# Patient Record
Sex: Male | Born: 1975 | Race: White | Hispanic: No | Marital: Married | Smoking: Never smoker
Health system: Southern US, Community
[De-identification: ages and names within clinical notes are randomized; demographics above are authoritative.]

## PROBLEM LIST (undated history)

## (undated) DIAGNOSIS — H7090 Unspecified mastoiditis, unspecified ear: Secondary | ICD-10-CM

## (undated) DIAGNOSIS — N200 Calculus of kidney: Secondary | ICD-10-CM

## (undated) HISTORY — PX: OTHER SURGICAL HISTORY: SHX169

---

## 2009-09-30 ENCOUNTER — Inpatient Hospital Stay: Payer: Self-pay | Admitting: Internal Medicine

## 2011-02-12 ENCOUNTER — Emergency Department: Payer: Self-pay | Admitting: Emergency Medicine

## 2011-02-13 ENCOUNTER — Emergency Department: Payer: Self-pay | Admitting: Unknown Physician Specialty

## 2011-02-17 ENCOUNTER — Emergency Department: Payer: Self-pay | Admitting: Emergency Medicine

## 2011-03-04 ENCOUNTER — Ambulatory Visit: Payer: Self-pay | Admitting: Family Medicine

## 2011-03-10 ENCOUNTER — Encounter: Payer: Self-pay | Admitting: Family Medicine

## 2011-04-04 ENCOUNTER — Encounter: Payer: Self-pay | Admitting: Family Medicine

## 2011-04-28 ENCOUNTER — Ambulatory Visit: Payer: Self-pay | Admitting: Unknown Physician Specialty

## 2011-05-05 ENCOUNTER — Encounter: Payer: Self-pay | Admitting: Family Medicine

## 2011-05-12 ENCOUNTER — Ambulatory Visit: Payer: Self-pay | Admitting: Family Medicine

## 2011-09-29 ENCOUNTER — Encounter: Payer: Self-pay | Admitting: Unknown Physician Specialty

## 2012-01-13 ENCOUNTER — Encounter: Payer: Self-pay | Admitting: Unknown Physician Specialty

## 2012-02-08 ENCOUNTER — Encounter: Payer: Self-pay | Admitting: Unknown Physician Specialty

## 2012-03-03 ENCOUNTER — Ambulatory Visit: Payer: Self-pay | Admitting: Family Medicine

## 2012-03-04 ENCOUNTER — Encounter: Payer: Self-pay | Admitting: Unknown Physician Specialty

## 2012-04-03 ENCOUNTER — Encounter: Payer: Self-pay | Admitting: Unknown Physician Specialty

## 2012-05-29 ENCOUNTER — Ambulatory Visit: Payer: Self-pay | Admitting: Surgery

## 2012-05-31 LAB — PATHOLOGY REPORT

## 2013-01-12 ENCOUNTER — Ambulatory Visit: Payer: Self-pay | Admitting: Family Medicine

## 2014-02-24 ENCOUNTER — Emergency Department: Payer: Self-pay | Admitting: Emergency Medicine

## 2015-01-21 NOTE — Op Note (Signed)
PATIENT NAME:  Dylan Willis, Dylan Willis MR#:  [id number] DATE OF BIRTH:  1975-12-05  DATE OF PROCEDURE:  05/29/2012  PREOPERATIVE DIAGNOSIS: Mass of the right upper back.   POSTOPERATIVE DIAGNOSIS: Intramuscular lipoma right upper back.   PROCEDURE: Excision mass right upper back.   SURGEON: Renda RollsWilton Smith, M.D.   ANESTHESIA: General.   INDICATIONS: This 39 year old male had a history of mild discomfort and bulging in the right upper back. A mass could be palpated on exam and appeared to be somewhat deep. Surgery was recommended to excise the mass, alleviate his symptoms, and send for pathology.   PROCEDURE:  The patient was placed on the operating table in the supine position under general anesthesia. He was rolled into the left lateral decubitus position and the bean bag cushion was used and it was activated. The right arm was placed on a padded Mayo stand. Tape was used to help hold his arm in position. The mass was palpable below the tip of the scapula. The site was prepared with ChloraPrep and draped in a sterile manner.   An infrascapular, transversely-oriented incision was made, which was lengthened several times during the course of the procedure, which ultimately was a length of some 10 cm. The incision was carried down through subcutaneous tissues. Several small bleeding points were cauterized. The superficial fascia was incised. Dissection was carried down to the deep fascia, which was incised transversely. The fascia was separated from the underlying latissimus dorsi muscle.  The muscles fibers were spread and I dissected down approximately another 1.5 cm to encounter a fatty-appearing demarcated mass, which was dissected free from surrounding structures. It appeared to be an intramuscular lipoma. Muscle fibers were dissected off the mass with a smooth plane of resection and the mass with somewhat tedious dissection was completely excised. Several small bleeding points were cauterized. The  measurement was 6 cm. It was submitted in formalin for routine pathology. The wound was inspected. Several small bleeding points were cauterized. Hemostasis was subsequently intact. Next, the latissimus dorsi was closed with interrupted 3-0 Monocryl sutures. Next, the deep fascia was closed with interrupted 3-0 Monocryl. The superficial fascia was closed with interrupted 3-0 Monocryl and the skin was closed initially with three inverted subcuticular interrupted 4-0 Monocryl sutures and then the skin was closed with a running 4-0 Monocryl subcuticular suture and Dermabond. The patient tolerated surgery satisfactorily and was then prepared for transfer to the recovery room.     ____________________________ Shela CommonsJ. Renda RollsWilton Smith, MD [id number] D: 05/29/2012 09:16:34 ET T: 05/29/2012 12:34:32 ET JOB#: [id number]  cc: Adella HareJ. Wilton Smith, MD, <Dictator> Adella HareWILTON J SMITH MD ELECTRONICALLY SIGNED 05/31/2012 18:29

## 2015-03-12 ENCOUNTER — Encounter: Payer: Self-pay | Admitting: Emergency Medicine

## 2015-03-12 ENCOUNTER — Emergency Department
Admission: EM | Admit: 2015-03-12 | Discharge: 2015-03-12 | Disposition: A | Discharge status: Home / Self Care | Payer: Medicare Other | Attending: Emergency Medicine | Admitting: Emergency Medicine

## 2015-03-12 ENCOUNTER — Emergency Department: Payer: Medicare Other

## 2015-03-12 DIAGNOSIS — Z79899 Other long term (current) drug therapy: Secondary | ICD-10-CM | POA: Insufficient documentation

## 2015-03-12 DIAGNOSIS — Z791 Long term (current) use of non-steroidal anti-inflammatories (NSAID): Secondary | ICD-10-CM | POA: Insufficient documentation

## 2015-03-12 DIAGNOSIS — N201 Calculus of ureter: Secondary | ICD-10-CM | POA: Diagnosis not present

## 2015-03-12 DIAGNOSIS — R109 Unspecified abdominal pain: Secondary | ICD-10-CM | POA: Diagnosis present

## 2015-03-12 DIAGNOSIS — Z7951 Long term (current) use of inhaled steroids: Secondary | ICD-10-CM | POA: Diagnosis not present

## 2015-03-12 DIAGNOSIS — Z880 Allergy status to penicillin: Secondary | ICD-10-CM | POA: Insufficient documentation

## 2015-03-12 HISTORY — DX: Calculus of kidney: N20.0

## 2015-03-12 LAB — URINALYSIS COMPLETE WITH MICROSCOPIC (ARMC ONLY)
BILIRUBIN URINE: NEGATIVE
Glucose, UA: NEGATIVE mg/dL
Ketones, ur: NEGATIVE mg/dL
NITRITE: NEGATIVE
PH: 5 (ref 5.0–8.0)
Protein, ur: NEGATIVE mg/dL
Specific Gravity, Urine: 1.017 (ref 1.005–1.030)

## 2015-03-12 LAB — CBC WITH DIFFERENTIAL/PLATELET
Basophils Absolute: 0.2 10*3/uL — ABNORMAL HIGH (ref 0–0.1)
Basophils Relative: 3 %
EOS ABS: 0.1 10*3/uL (ref 0–0.7)
Eosinophils Relative: 1 %
HEMATOCRIT: 37.2 % — AB (ref 40.0–52.0)
Hemoglobin: 12.3 g/dL — ABNORMAL LOW (ref 13.0–18.0)
LYMPHS ABS: 0.7 10*3/uL — AB (ref 1.0–3.6)
Lymphocytes Relative: 7 %
MCH: 28.8 pg (ref 26.0–34.0)
MCHC: 33.2 g/dL (ref 32.0–36.0)
MCV: 86.8 fL (ref 80.0–100.0)
MONOS PCT: 5 %
Monocytes Absolute: 0.4 10*3/uL (ref 0.2–1.0)
NEUTROS ABS: 7.8 10*3/uL — AB (ref 1.4–6.5)
NEUTROS PCT: 84 %
PLATELETS: 169 10*3/uL (ref 150–440)
RBC: 4.29 MIL/uL — AB (ref 4.40–5.90)
RDW: 13.2 % (ref 11.5–14.5)
WBC: 9.3 10*3/uL (ref 3.8–10.6)

## 2015-03-12 LAB — COMPREHENSIVE METABOLIC PANEL
ALK PHOS: 50 U/L (ref 38–126)
ALT: 24 U/L (ref 17–63)
AST: 19 U/L (ref 15–41)
Albumin: 4.6 g/dL (ref 3.5–5.0)
Anion gap: 9 (ref 5–15)
BILIRUBIN TOTAL: 0.6 mg/dL (ref 0.3–1.2)
BUN: 21 mg/dL — AB (ref 6–20)
CHLORIDE: 105 mmol/L (ref 101–111)
CO2: 26 mmol/L (ref 22–32)
Calcium: 9.1 mg/dL (ref 8.9–10.3)
Creatinine, Ser: 1.28 mg/dL — ABNORMAL HIGH (ref 0.61–1.24)
GFR calc Af Amer: >60 mL/min (ref >60)
Glucose, Bld: 100 mg/dL — ABNORMAL HIGH (ref 65–99)
POTASSIUM: 3.9 mmol/L (ref 3.5–5.1)
Sodium: 140 mmol/L (ref 135–145)
TOTAL PROTEIN: 7.7 g/dL (ref 6.5–8.1)

## 2015-03-12 LAB — LIPASE, BLOOD: Lipase: 46 U/L (ref 22–51)

## 2015-03-12 MED ORDER — MORPHINE SULFATE 4 MG/ML IJ SOLN
INTRAMUSCULAR | Status: AC
  Filled 2015-03-12: qty 1

## 2015-03-12 MED ORDER — TAMSULOSIN HCL 0.4 MG PO CAPS: 0.4000 mg | ORAL_CAPSULE | Freq: Every day | ORAL | Status: AC

## 2015-03-12 MED ORDER — MORPHINE SULFATE 4 MG/ML IJ SOLN
INTRAMUSCULAR | Status: AC
  Administered 2015-03-12: 4 mg
  Filled 2015-03-12: qty 1

## 2015-03-12 MED ORDER — OXYCODONE HCL 5 MG PO TABS: 5.0000 mg | ORAL_TABLET | Freq: Four times a day (QID) | ORAL | Status: DC | PRN

## 2015-03-12 MED ORDER — MORPHINE SULFATE 4 MG/ML IJ SOLN
4.0000 mg | Freq: Once | INTRAMUSCULAR | Status: AC
  Administered 2015-03-12: 4 mg via INTRAVENOUS

## 2015-03-12 MED ORDER — ONDANSETRON HCL 4 MG/2ML IJ SOLN
INTRAMUSCULAR | Status: DC
Start: 2015-03-12 — End: 2015-03-12
  Filled 2015-03-12: qty 2

## 2015-03-12 MED ORDER — SODIUM CHLORIDE 0.9 % IV BOLUS (SEPSIS)
1000.0000 mL | Freq: Once | INTRAVENOUS | Status: AC
  Administered 2015-03-12: 1000 mL via INTRAVENOUS

## 2015-03-12 MED ORDER — TAMSULOSIN HCL 0.4 MG PO CAPS: 0.4000 mg | ORAL_CAPSULE | Freq: Once | ORAL | Status: DC

## 2015-03-12 MED ORDER — ONDANSETRON HCL 4 MG/2ML IJ SOLN
4.0000 mg | Freq: Once | INTRAMUSCULAR | Status: AC
  Administered 2015-03-12: 4 mg via INTRAVENOUS

## 2015-03-12 NOTE — ED Notes (Signed)
Pt transported to US

## 2015-03-12 NOTE — ED Notes (Signed)
Reports right flank pain and burning with urination

## 2015-03-12 NOTE — ED Provider Notes (Signed)
[facility] Emergency Department Provider Note  ____________________________________________  Time seen: Approximately 8:30 AM  I have reviewed the triage vital signs and the nursing notes.   HISTORY  Chief Complaint Flank Pain    HPI Marya LandryDavid R Valls is a 39 y.o. male the history of kidney stones who presents with several days of right flank pain. He says the pain has been intermittent but steadily increasing over the past several days. The pain is sharp and cramping and to his right flank and lumbar area but radiating around to the right lower abdomen. He vomited once yesterday. Slight burning upon urination but no gross blood in the urine. Says feels similar to previous kidney stones.   Past Medical History  Diagnosis Date  . Kidney stones   . Kidney stone     There are no active problems to display for this patient.   History reviewed. No pertinent past surgical history.  Current Outpatient Rx  Name  Route  Sig  Dispense  Refill  . albuterol (PROVENTIL HFA;VENTOLIN HFA) 108 (90 BASE) MCG/ACT inhaler   Inhalation   Inhale 1-2 puffs into the lungs every 6 (six) hours as needed for wheezing or shortness of breath.         . escitalopram (LEXAPRO) 20 MG tablet   Oral   Take 20 mg by mouth every morning.         . fluticasone (FLOVENT HFA) 220 MCG/ACT inhaler   Inhalation   Inhale 1 puff into the lungs 2 (two) times daily.         Marland Kitchen. glucose 4 GM chewable tablet   Oral   Chew 1 tablet by mouth as needed for low blood sugar.         . levothyroxine (SYNTHROID, LEVOTHROID) 150 MCG tablet   Oral   Take 150 mcg by mouth daily.         . meloxicam (MOBIC) 7.5 MG tablet   Oral   Take 7.5 mg by mouth 2 (two) times daily.         . montelukast (SINGULAIR) 10 MG tablet   Oral   Take 10 mg by mouth daily.         Marland Kitchen. oxycodone (OXY-IR) 5 MG capsule   Oral   Take 5 mg by mouth every 8 (eight) hours as needed for pain.            Allergies Ibuprofen; Penicillins; Antihistamines, diphenhydramine-type; Pseudoephedrine hcl; Salicylates; and Acetaminophen  History reviewed. No pertinent family history.  Social History History  Substance Use Topics  . Smoking status: Never Smoker   . Smokeless tobacco: Not on file  . Alcohol Use: No    Review of Systems Constitutional: No fever/chills Eyes: No visual changes. ENT: No sore throat. Cardiovascular: Denies chest pain. Respiratory: Denies shortness of breath. Gastrointestinal: As above Genitourinary: Negative for dysuria. Musculoskeletal: Right lumbar pain  Skin: Negative for rash. Neurological: Negative for headaches, focal weakness or numbness.  10-point ROS otherwise negative.  ____________________________________________   PHYSICAL EXAM:  VITAL SIGNS: ED Triage Vitals  Enc Vitals Group     BP 03/12/15 0834 155/100 mmHg     Pulse Rate 03/12/15 0834 71     Resp 03/12/15 0834 20     Temp 03/12/15 0834 98.1 F (36.7 C)     Temp Source 03/12/15 0834 Oral     SpO2 03/12/15 0834 98 %     Weight 03/12/15 0834 247 lb (112.038 kg)  Height 03/12/15 0834 6' (1.829 m)     Head Cir --      Peak Flow --      Pain Score 03/12/15 0835 10     Pain Loc --      Pain Edu? --      Excl. in GC? --     Constitutional: Alert and oriented. Well appearing and in no acute distress. Eyes: Conjunctivae are normal. PERRL. EOMI. Head: Atraumatic. Nose: No congestion/rhinnorhea. Mouth/Throat: Mucous membranes are moist.  Oropharynx non-erythematous. Neck: No stridor.   Cardiovascular: Normal rate, regular rhythm. Grossly normal heart sounds.  Good peripheral circulation. Respiratory: Normal respiratory effort.  No retractions. Lungs CTAB. Gastrointestinal: Soft and nontender. No distention. No abdominal bruits. Right flank and right lumbar tenderness. Musculoskeletal: No lower extremity tenderness nor edema.  No joint effusions. Neurologic:  Normal speech and  language. No gross focal neurologic deficits are appreciated. Speech is normal. No gait instability. Skin:  Skin is warm, dry and intact. No rash noted. Psychiatric: Mood and affect are normal. Speech and behavior are normal.  ____________________________________________   LABS (all labs ordered are listed, but only abnormal results are displayed)  Labs Reviewed  CBC WITH DIFFERENTIAL/PLATELET - Abnormal; Notable for the following:    RBC 4.29 (*)    Hemoglobin 12.3 (*)    HCT 37.2 (*)    Neutro Abs 7.8 (*)    Lymphs Abs 0.7 (*)    Basophils Absolute 0.2 (*)    All other components within normal limits  COMPREHENSIVE METABOLIC PANEL - Abnormal; Notable for the following:    Glucose, Bld 100 (*)    BUN 21 (*)    Creatinine, Ser 1.28 (*)    All other components within normal limits  URINALYSIS COMPLETEWITH MICROSCOPIC ([facility] ONLY) - Abnormal; Notable for the following:    Color, Urine YELLOW (*)    APPearance CLEAR (*)    Hgb urine dipstick 3+ (*)    Leukocytes, UA TRACE (*)    Bacteria, UA RARE (*)    Squamous Epithelial / LPF 0-5 (*)    All other components within normal limits  LIPASE, BLOOD   ____________________________________________  EKG   ____________________________________________  RADIOLOGY   Mild right-sided hydro-on ultrasound. Likely 5 mm distal stone on KUB. ____________________________________________   PROCEDURES   ____________________________________________   INITIAL IMPRESSION / ASSESSMENT AND PLAN / ED COURSE  Pertinent labs & imaging results that were available during my care of the patient were reviewed by me and considered in my medical decision making (see chart for details).  ----------------------------------------- 12:23 PM on 03/12/2015 -----------------------------------------  Patient resting comfortably after 2 doses of morphine. Discussed case with Vanna Scotland of urology who will see patient has follow-up in the office.  We'll discharge the patient with Flomax. We'll discharge also with a strainer. ____________________________________________   FINAL CLINICAL IMPRESSION(S) / ED DIAGNOSES  Acute ureterolithiasis. Initial visit.    Myrna Blazer, MD 03/12/15 1224

## 2015-03-12 NOTE — Discharge Instructions (Signed)

## 2015-03-21 ENCOUNTER — Ambulatory Visit: Payer: Self-pay | Admitting: Urology

## 2018-03-30 ENCOUNTER — Emergency Department
Admission: EM | Admit: 2018-03-30 | Discharge: 2018-03-30 | Disposition: A | Discharge status: Home / Self Care | Payer: Medicare Other | Attending: Emergency Medicine | Admitting: Emergency Medicine

## 2018-03-30 ENCOUNTER — Emergency Department: Payer: Medicare Other

## 2018-03-30 ENCOUNTER — Other Ambulatory Visit: Payer: Self-pay

## 2018-03-30 ENCOUNTER — Encounter: Payer: Self-pay | Admitting: Emergency Medicine

## 2018-03-30 DIAGNOSIS — R509 Fever, unspecified: Secondary | ICD-10-CM | POA: Diagnosis present

## 2018-03-30 DIAGNOSIS — R51 Headache: Secondary | ICD-10-CM | POA: Insufficient documentation

## 2018-03-30 DIAGNOSIS — N390 Urinary tract infection, site not specified: Secondary | ICD-10-CM

## 2018-03-30 DIAGNOSIS — R300 Dysuria: Secondary | ICD-10-CM | POA: Diagnosis not present

## 2018-03-30 DIAGNOSIS — Z79899 Other long term (current) drug therapy: Secondary | ICD-10-CM | POA: Insufficient documentation

## 2018-03-30 DIAGNOSIS — R531 Weakness: Secondary | ICD-10-CM | POA: Insufficient documentation

## 2018-03-30 DIAGNOSIS — R5381 Other malaise: Secondary | ICD-10-CM | POA: Insufficient documentation

## 2018-03-30 HISTORY — DX: Unspecified mastoiditis, unspecified ear: H70.90

## 2018-03-30 LAB — COMPREHENSIVE METABOLIC PANEL
ALK PHOS: 64 U/L (ref 38–126)
ALT: 25 U/L (ref 0–44)
ANION GAP: 11 (ref 5–15)
AST: 22 U/L (ref 15–41)
Albumin: 4.7 g/dL (ref 3.5–5.0)
BUN: 18 mg/dL (ref 6–20)
CALCIUM: 9 mg/dL (ref 8.9–10.3)
CO2: 22 mmol/L (ref 22–32)
Chloride: 100 mmol/L (ref 98–111)
Creatinine, Ser: 1.13 mg/dL (ref 0.61–1.24)
Glucose, Bld: 132 mg/dL — ABNORMAL HIGH (ref 70–99)
Potassium: 4.2 mmol/L (ref 3.5–5.1)
SODIUM: 133 mmol/L — AB (ref 135–145)
TOTAL PROTEIN: 8 g/dL (ref 6.5–8.1)
Total Bilirubin: 1.8 mg/dL — ABNORMAL HIGH (ref 0.3–1.2)

## 2018-03-30 LAB — CBC WITH DIFFERENTIAL/PLATELET
BASOS ABS: 0 10*3/uL (ref 0–0.1)
BASOS PCT: 0 %
Eosinophils Absolute: 0 10*3/uL (ref 0–0.7)
Eosinophils Relative: 0 %
HCT: 38.5 % — ABNORMAL LOW (ref 40.0–52.0)
Hemoglobin: 13 g/dL (ref 13.0–18.0)
Lymphocytes Relative: 5 %
Lymphs Abs: 0.6 10*3/uL — ABNORMAL LOW (ref 1.0–3.6)
MCH: 29.2 pg (ref 26.0–34.0)
MCHC: 33.7 g/dL (ref 32.0–36.0)
MCV: 86.7 fL (ref 80.0–100.0)
Monocytes Absolute: 0.2 10*3/uL (ref 0.2–1.0)
Monocytes Relative: 1 %
NEUTROS ABS: 11.9 10*3/uL — AB (ref 1.4–6.5)
NEUTROS PCT: 94 %
Platelets: 178 10*3/uL (ref 150–440)
RBC: 4.44 MIL/uL (ref 4.40–5.90)
RDW: 14.1 % (ref 11.5–14.5)
WBC: 12.7 10*3/uL — AB (ref 3.8–10.6)

## 2018-03-30 LAB — URINALYSIS, COMPLETE (UACMP) WITH MICROSCOPIC
BACTERIA UA: NONE SEEN
Bilirubin Urine: NEGATIVE
GLUCOSE, UA: NEGATIVE mg/dL
KETONES UR: NEGATIVE mg/dL
NITRITE: NEGATIVE
PH: 5 (ref 5.0–8.0)
PROTEIN: 100 mg/dL — AB
Specific Gravity, Urine: 1.026 (ref 1.005–1.030)
WBC, UA: >50 WBC/hpf — ABNORMAL HIGH (ref 0–5)

## 2018-03-30 LAB — LACTIC ACID, PLASMA
LACTIC ACID, VENOUS: 1.3 mmol/L (ref 0.5–1.9)
Lactic Acid, Venous: 1.8 mmol/L (ref 0.5–1.9)

## 2018-03-30 MED ORDER — SODIUM CHLORIDE 0.9 % IV BOLUS
1000.0000 mL | Freq: Once | INTRAVENOUS | Status: AC
  Administered 2018-03-30: 1000 mL via INTRAVENOUS

## 2018-03-30 MED ORDER — CEFTRIAXONE SODIUM 1 G IJ SOLR
1.0000 g | Freq: Once | INTRAMUSCULAR | Status: AC
  Administered 2018-03-30: 1 g via INTRAVENOUS
  Filled 2018-03-30: qty 10

## 2018-03-30 MED ORDER — CEPHALEXIN 500 MG PO CAPS: 500.0000 mg | ORAL_CAPSULE | Freq: Two times a day (BID) | ORAL | 0 refills | Status: AC

## 2018-03-30 NOTE — ED Notes (Addendum)
Blood cultures drawn and sent with save labels d/t impending antibiotic administration as well as RR/HR/temp in triage. Meets SIRS criteria for sepsis workup. No orders at this time. MD aware that blood cultures have been sent. Second lactate still drawn.

## 2018-03-30 NOTE — ED Provider Notes (Signed)
[facility] Emergency Department Provider Note ____________________________________________   First MD Initiated Contact with Patient 03/30/18 1334     (approximate)  I have reviewed the triage vital signs and the nursing notes.   HISTORY  Chief Complaint Fever    HPI Dylan Willis is a 42 y.o. male with PMH as noted below who presents with fever for 1 day, measured to 104 at home, associated with generalized weakness and malaise, headache, and dysuria.  Patient denies hematuria or any flank or back pain.  He denies vomiting or diarrhea, or any respiratory symptoms.  He reports prior history of kidney stones as well as UTI in the past.  Past Medical History:  Diagnosis Date  . Kidney stone   . Kidney stones   . Mastoiditis     There are no active problems to display for this patient.   Past Surgical History:  Procedure Laterality Date  . ear surgery Right     Prior to Admission medications   Medication Sig Start Date End Date Taking? Authorizing Provider  albuterol (PROVENTIL HFA;VENTOLIN HFA) 108 (90 BASE) MCG/ACT inhaler Inhale 1-2 puffs into the lungs every 6 (six) hours as needed for wheezing or shortness of breath.    [provider]  cephALEXin (KEFLEX) 500 MG capsule Take 1 capsule (500 mg total) by mouth 2 (two) times daily for 14 days. 03/30/18 04/13/18  Dionne Bucy, MD  escitalopram (LEXAPRO) 20 MG tablet Take 20 mg by mouth every morning.    [provider]  fluticasone (FLOVENT HFA) 220 MCG/ACT inhaler Inhale 1 puff into the lungs 2 (two) times daily.    [provider]  glucose 4 GM chewable tablet Chew 1 tablet by mouth as needed for low blood sugar.    [provider]  levothyroxine (SYNTHROID, LEVOTHROID) 150 MCG tablet Take 150 mcg by mouth daily.    [provider]  meloxicam (MOBIC) 7.5 MG tablet Take 7.5 mg by mouth 2 (two) times daily.    [provider]  montelukast  (SINGULAIR) 10 MG tablet Take 10 mg by mouth daily.    [provider]  oxyCODONE (OXY IR/ROXICODONE) 5 MG immediate release tablet Take 1 tablet (5 mg total) by mouth every 6 (six) hours as needed for severe pain. 03/12/15   Schaevitz, Myra Rude, MD  tamsulosin (FLOMAX) 0.4 MG CAPS capsule Take 1 capsule (0.4 mg total) by mouth daily. 03/12/15   Myrna Blazer, MD    Allergies Ibuprofen; Penicillins; Antihistamines, diphenhydramine-type; Pseudoephedrine hcl; Salicylates; and Acetaminophen  No family history on file.  Social History Social History   Tobacco Use  . Smoking status: Never Smoker  . Smokeless tobacco: Never Used  Substance Use Topics  . Alcohol use: No  . Drug use: Not on file    Review of Systems  Constitutional: Positive for fever. Eyes: No redness. ENT: No sore throat. Cardiovascular: Denies chest pain. Respiratory: Denies shortness of breath. Gastrointestinal: No vomiting or diarrhea.  Genitourinary: N positive for dysuria.  Musculoskeletal: Negative for back pain. Skin: Negative for rash. Neurological: Positive for headache.   ____________________________________________   PHYSICAL EXAM:  VITAL SIGNS: ED Triage Vitals  Enc Vitals Group     BP 03/30/18 1135 124/69     Pulse Rate 03/30/18 1135 (!) 114     Resp 03/30/18 1134 16     Temp 03/30/18 1134 (!) 102.9 F (39.4 C)     Temp Source 03/30/18 1134 Oral  SpO2 03/30/18 1135 94 %     Weight 03/30/18 1132 260 lb (117.9 kg)     Height 03/30/18 1132 6' (1.829 m)     Head Circumference --      Peak Flow --      Pain Score 03/30/18 1132 7     Pain Loc --      Pain Edu? --      Excl. in GC? --     Constitutional: Alert and oriented.  Relatively well appearing and in no acute distress. Eyes: Conjunctivae are normal.  Head: Atraumatic. Nose: No congestion/rhinnorhea. Mouth/Throat: Mucous membranes are slightly dry.   Neck: Normal range of motion.  Cardiovascular: Normal  rate, regular rhythm. Grossly normal heart sounds.  Good peripheral circulation. Respiratory: Normal respiratory effort.  No retractions. Lungs CTAB. Gastrointestinal: Soft and nontender. No distention.  Genitourinary: No CVA tenderness. Musculoskeletal:  Extremities warm and well perfused.  Neurologic:  Normal speech and language. No gross focal neurologic deficits are appreciated.  Skin:  Skin is warm and dry. No rash noted. Psychiatric: Mood and affect are normal. Speech and behavior are normal.  ____________________________________________   LABS (all labs ordered are listed, but only abnormal results are displayed)  Labs Reviewed  COMPREHENSIVE METABOLIC PANEL - Abnormal; Notable for the following components:      Result Value   Sodium 133 (*)    Glucose, Bld 132 (*)    Total Bilirubin 1.8 (*)    All other components within normal limits  CBC WITH DIFFERENTIAL/PLATELET - Abnormal; Notable for the following components:   WBC 12.7 (*)    HCT 38.5 (*)    Neutro Abs 11.9 (*)    Lymphs Abs 0.6 (*)    All other components within normal limits  URINALYSIS, COMPLETE (UACMP) WITH MICROSCOPIC - Abnormal; Notable for the following components:   Color, Urine AMBER (*)    APPearance TURBID (*)    Hgb urine dipstick LARGE (*)    Protein, ur 100 (*)    Leukocytes, UA MODERATE (*)    RBC / HPF >50 (*)    WBC, UA >50 (*)    All other components within normal limits  LACTIC ACID, PLASMA  LACTIC ACID, PLASMA   ____________________________________________  EKG   ____________________________________________  RADIOLOGY  CXR: No focal infiltrate or other acute findings  ____________________________________________   PROCEDURES  Procedure(s) performed: No  Procedures  Critical Care performed: No ____________________________________________   INITIAL IMPRESSION / ASSESSMENT AND PLAN / ED COURSE  Pertinent labs & imaging results that were available during my care of the  patient were reviewed by me and considered in my medical decision making (see chart for details).  42 year old male with PMH as noted above presents with 1 day of fever, generalized weakness and malaise, and dysuria.  He reports prior history of kidney stones as well as UTIs in the past.  I reviewed the past medical records in Epic; patient's only prior ED visit here was 3 years ago for a kidney stone, the patient was treated and released.  On exam, the patient is febrile but other vital signs are normal.  He is relatively well-appearing, and the exam is otherwise unremarkable.  No abdominal or CVA tenderness.  Lab work-up from triage is unremarkable, except his UA is consistent with UTI.  Given the fever and dysuria this is consistent with his clinical presentation.  The patient does not know of any specific urologic abnormality or anything that might be predisposing him to  UTI, however he does report a prior history of this.  We will give fluids and a dose of antibiotic here, and send the patient home with oral antibiotics.  I will also recommend that the patient follow-up with urology for further work-up once the infection clears.  ----------------------------------------- 2:53 PM on 03/30/2018 -----------------------------------------  Patient is stable for discharge home.  He is penicillin allergic but given his lack of history of anaphylaxis, he is low likelihood to have allergy to cephalosporin.  Ceftriaxone was given in the ED with no adverse effect.  I will prescribe Keflex for home.  I advised the patient on the results of the work-up and the plan of care.  Return precautions given, and he expresses understanding. ____________________________________________   FINAL CLINICAL IMPRESSION(S) / ED DIAGNOSES  Final diagnoses:  Urinary tract infection without hematuria, site unspecified      NEW MEDICATIONS STARTED DURING THIS VISIT:  New Prescriptions   CEPHALEXIN (KEFLEX) 500 MG  CAPSULE    Take 1 capsule (500 mg total) by mouth 2 (two) times daily for 14 days.     Note:  This document was prepared using Dragon voice recognition software and may include unintentional dictation errors.    Dionne Bucy, MD 03/30/18 1455

## 2018-03-30 NOTE — ED Triage Notes (Signed)
C/O fever x 1 day.  This morning also had some vomiting.  Temp was 104 at 1030, 2 Alleve taken.

## 2018-03-30 NOTE — Discharge Instructions (Addendum)
Take the antibiotic as prescribed and finish the full course.  You may take Tylenol for fever.  We have provided referral to a urologist and it is important that you follow-up to help decide if you need any other testing as an outpatient.  Return to the ER for new, worsening, persistent high fever, weakness, vomiting, inability to take the antibiotic, severe back pain, or any other new or worsening symptoms that concern you.

## 2018-04-03 ENCOUNTER — Emergency Department: Payer: Medicare Other

## 2018-04-03 ENCOUNTER — Emergency Department
Admission: EM | Admit: 2018-04-03 | Discharge: 2018-04-03 | Disposition: A | Discharge status: Home / Self Care | Payer: Medicare Other | Attending: Emergency Medicine | Admitting: Emergency Medicine

## 2018-04-03 ENCOUNTER — Other Ambulatory Visit: Payer: Self-pay

## 2018-04-03 DIAGNOSIS — Z79899 Other long term (current) drug therapy: Secondary | ICD-10-CM | POA: Insufficient documentation

## 2018-04-03 DIAGNOSIS — Y939 Activity, unspecified: Secondary | ICD-10-CM | POA: Insufficient documentation

## 2018-04-03 DIAGNOSIS — Y999 Unspecified external cause status: Secondary | ICD-10-CM | POA: Diagnosis not present

## 2018-04-03 DIAGNOSIS — H02846 Edema of left eye, unspecified eyelid: Secondary | ICD-10-CM | POA: Diagnosis present

## 2018-04-03 DIAGNOSIS — Y929 Unspecified place or not applicable: Secondary | ICD-10-CM | POA: Diagnosis not present

## 2018-04-03 DIAGNOSIS — S0240FA Zygomatic fracture, left side, initial encounter for closed fracture: Secondary | ICD-10-CM | POA: Diagnosis not present

## 2018-04-03 DIAGNOSIS — H1132 Conjunctival hemorrhage, left eye: Secondary | ICD-10-CM | POA: Diagnosis not present

## 2018-04-03 MED ORDER — OXYCODONE HCL 5 MG PO TABS: 5.0000 mg | ORAL_TABLET | Freq: Three times a day (TID) | ORAL | 0 refills | Status: AC | PRN

## 2018-04-03 MED ORDER — HYDROCODONE-ACETAMINOPHEN 5-325 MG PO TABS
2.0000 | ORAL_TABLET | Freq: Once | ORAL | Status: AC
  Administered 2018-04-03: 2 via ORAL
  Filled 2018-04-03: qty 2

## 2018-04-03 NOTE — ED Notes (Signed)
ED Provider at bedside. 
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
## 2018-04-03 NOTE — ED Notes (Signed)
Right eye - 20/50 Left eye   - 20/40 Both        - 20/30
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
## 2023-01-09 ENCOUNTER — Encounter (HOSPITAL_COMMUNITY): Payer: Self-pay

## 2023-01-09 ENCOUNTER — Emergency Department (HOSPITAL_COMMUNITY): Payer: Medicaid Other

## 2023-01-09 ENCOUNTER — Other Ambulatory Visit: Payer: Self-pay

## 2023-01-09 ENCOUNTER — Emergency Department (HOSPITAL_COMMUNITY)
Admission: EM | Admit: 2023-01-09 | Discharge: 2023-01-09 | Disposition: A | Discharge status: Home / Self Care | Payer: Medicaid Other | Attending: Emergency Medicine | Admitting: Emergency Medicine

## 2023-01-09 DIAGNOSIS — Z79899 Other long term (current) drug therapy: Secondary | ICD-10-CM | POA: Insufficient documentation

## 2023-01-09 DIAGNOSIS — R519 Headache, unspecified: Secondary | ICD-10-CM | POA: Insufficient documentation

## 2023-01-09 DIAGNOSIS — M79602 Pain in left arm: Secondary | ICD-10-CM | POA: Insufficient documentation

## 2023-01-09 DIAGNOSIS — S0990XA Unspecified injury of head, initial encounter: Secondary | ICD-10-CM | POA: Diagnosis present

## 2023-01-09 DIAGNOSIS — Z23 Encounter for immunization: Secondary | ICD-10-CM | POA: Diagnosis not present

## 2023-01-09 DIAGNOSIS — S0101XA Laceration without foreign body of scalp, initial encounter: Secondary | ICD-10-CM | POA: Diagnosis not present

## 2023-01-09 LAB — COMPREHENSIVE METABOLIC PANEL
ALT: 29 U/L (ref 0–44)
AST: 21 U/L (ref 15–41)
Albumin: 3.4 g/dL — ABNORMAL LOW (ref 3.5–5.0)
Alkaline Phosphatase: 49 U/L (ref 38–126)
Anion gap: 11 (ref 5–15)
BUN: 13 mg/dL (ref 6–20)
CO2: 21 mmol/L — ABNORMAL LOW (ref 22–32)
Calcium: 7.7 mg/dL — ABNORMAL LOW (ref 8.9–10.3)
Chloride: 107 mmol/L (ref 98–111)
Creatinine, Ser: 0.96 mg/dL (ref 0.61–1.24)
GFR, Estimated: >60 mL/min (ref >60)
Glucose, Bld: 90 mg/dL (ref 70–99)
Potassium: 2.9 mmol/L — ABNORMAL LOW (ref 3.5–5.1)
Sodium: 139 mmol/L (ref 135–145)
Total Bilirubin: 0.6 mg/dL (ref 0.3–1.2)
Total Protein: 6 g/dL — ABNORMAL LOW (ref 6.5–8.1)

## 2023-01-09 LAB — CBC
HCT: 34.5 % — ABNORMAL LOW (ref 39.0–52.0)
Hemoglobin: 10.6 g/dL — ABNORMAL LOW (ref 13.0–17.0)
MCH: 28.4 pg (ref 26.0–34.0)
MCHC: 30.7 g/dL (ref 30.0–36.0)
MCV: 92.5 fL (ref 80.0–100.0)
Platelets: 174 10*3/uL (ref 150–400)
RBC: 3.73 MIL/uL — ABNORMAL LOW (ref 4.22–5.81)
RDW: 13.8 % (ref 11.5–15.5)
WBC: 7.2 10*3/uL (ref 4.0–10.5)
nRBC: 0 % (ref 0.0–0.2)

## 2023-01-09 LAB — LACTIC ACID, PLASMA: Lactic Acid, Venous: 1.4 mmol/L (ref 0.5–1.9)

## 2023-01-09 LAB — PROTIME-INR
INR: 1.1 (ref 0.8–1.2)
Prothrombin Time: 14.1 seconds (ref 11.4–15.2)

## 2023-01-09 LAB — ETHANOL: Alcohol, Ethyl (B): <10 mg/dL (ref <10)

## 2023-01-09 MED ORDER — OXYCODONE HCL 5 MG PO TABS: 5.0000 mg | ORAL_TABLET | Freq: Four times a day (QID) | ORAL | 0 refills | Status: AC | PRN

## 2023-01-09 MED ORDER — OXYCODONE HCL 5 MG PO TABS
5.0000 mg | ORAL_TABLET | Freq: Once | ORAL | Status: AC
  Administered 2023-01-09: 5 mg via ORAL
  Filled 2023-01-09: qty 1

## 2023-01-09 MED ORDER — LIDOCAINE-EPINEPHRINE (PF) 2 %-1:200000 IJ SOLN
10.0000 mL | Freq: Once | INTRAMUSCULAR | Status: AC
  Administered 2023-01-09: 10 mL via INTRADERMAL
  Filled 2023-01-09: qty 20

## 2023-01-09 MED ORDER — TETANUS-DIPHTH-ACELL PERTUSSIS 5-2.5-18.5 LF-MCG/0.5 IM SUSY
0.5000 mL | PREFILLED_SYRINGE | Freq: Once | INTRAMUSCULAR | Status: AC
  Administered 2023-01-09: 0.5 mL via INTRAMUSCULAR
  Filled 2023-01-09: qty 0.5

## 2023-01-09 MED ORDER — POTASSIUM CHLORIDE CRYS ER 20 MEQ PO TBCR
20.0000 meq | EXTENDED_RELEASE_TABLET | Freq: Once | ORAL | Status: AC
  Administered 2023-01-09: 20 meq via ORAL
  Filled 2023-01-09: qty 1

## 2023-01-09 MED ORDER — FENTANYL CITRATE PF 50 MCG/ML IJ SOSY
100.0000 ug | PREFILLED_SYRINGE | INTRAMUSCULAR | Status: DC | PRN
  Administered 2023-01-09: 100 ug via INTRAVENOUS
  Filled 2023-01-09: qty 2
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
## 2023-01-09 NOTE — ED Notes (Signed)
EDP at bedside  

## 2023-01-09 NOTE — ED Notes (Signed)
Left arm  arm abrasion cleaned and bandaged and Right hand abrasion cleaned and bandaged.

## 2023-01-09 NOTE — ED Notes (Signed)
Patient transported to CT 
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
## 2023-01-09 NOTE — ED Notes (Signed)
X-ray at bedside

## 2024-06-13 ENCOUNTER — Emergency Department
Admission: EM | Admit: 2024-06-13 | Discharge: 2024-06-14 | Disposition: A | Discharge status: Home / Self Care | Attending: Emergency Medicine | Admitting: Emergency Medicine

## 2024-06-13 ENCOUNTER — Other Ambulatory Visit: Payer: Self-pay

## 2024-06-13 ENCOUNTER — Emergency Department

## 2024-06-13 DIAGNOSIS — R0789 Other chest pain: Secondary | ICD-10-CM | POA: Diagnosis not present

## 2024-06-13 DIAGNOSIS — N179 Acute kidney failure, unspecified: Secondary | ICD-10-CM | POA: Diagnosis not present

## 2024-06-13 DIAGNOSIS — R079 Chest pain, unspecified: Secondary | ICD-10-CM | POA: Diagnosis present

## 2024-06-13 LAB — CBC
HCT: 38.7 % — ABNORMAL LOW (ref 39.0–52.0)
Hemoglobin: 12.4 g/dL — ABNORMAL LOW (ref 13.0–17.0)
MCH: 28.1 pg (ref 26.0–34.0)
MCHC: 32 g/dL (ref 30.0–36.0)
MCV: 87.6 fL (ref 80.0–100.0)
Platelets: 214 K/uL (ref 150–400)
RBC: 4.42 MIL/uL (ref 4.22–5.81)
RDW: 13.7 % (ref 11.5–15.5)
WBC: 8.6 K/uL (ref 4.0–10.5)
nRBC: 0 % (ref 0.0–0.2)

## 2024-06-13 LAB — BASIC METABOLIC PANEL WITH GFR
Anion gap: 11 (ref 5–15)
BUN: 17 mg/dL (ref 6–20)
CO2: 26 mmol/L (ref 22–32)
Calcium: 9.1 mg/dL (ref 8.9–10.3)
Chloride: 100 mmol/L (ref 98–111)
Creatinine, Ser: 1.54 mg/dL — ABNORMAL HIGH (ref 0.61–1.24)
GFR, Estimated: 55 mL/min — ABNORMAL LOW (ref >60)
Glucose, Bld: 97 mg/dL (ref 70–99)
Potassium: 3.9 mmol/L (ref 3.5–5.1)
Sodium: 137 mmol/L (ref 135–145)

## 2024-06-13 LAB — TROPONIN I (HIGH SENSITIVITY): Troponin I (High Sensitivity): 6 ng/L (ref <18)

## 2024-06-13 LAB — CK: Total CK: 122 U/L (ref 49–397)

## 2024-06-13 MED ORDER — SODIUM CHLORIDE 0.9 % IV BOLUS (SEPSIS)
1000.0000 mL | Freq: Once | INTRAVENOUS | Status: AC
  Administered 2024-06-13: 1000 mL via INTRAVENOUS

## 2024-06-13 NOTE — ED Triage Notes (Signed)
 Pt reports he was doing some yard work and began to have chest pain, pt reports hx of enlarged heart. Pt denies accompanying symptoms with the pain.

## 2024-06-13 NOTE — ED Provider Notes (Signed)
 [facility] Provider Note    Event Date/Time   First MD Initiated Contact with Patient 06/13/24 2308     (approximate)   History   Chest Pain   HPI  Dylan Willis is a 48 y.o. male with history of obesity, kidney stones who presents to the emergency department complaints of electric shocklike left-sided chest pain without radiation that occurred earlier today while working in the yard.  He states that symptoms only lasted for a few seconds and then would resolve.  No aggravating or alleviating factors.  Has never had similar symptoms.  No other numbness, tingling, weakness, headache, neck or back pain.  No shortness of breath, nausea or vomiting, fever or cough, lower extremity swelling or pain.  No history of PE, DVT, exogenous estrogen use, recent fractures, surgery, trauma, hospitalization, prolonged travel or other immobilization. No lower extremity swelling or pain. No calf tenderness.  Asymptomatic currently.   History provided by patient, wife.    Past Medical History:  Diagnosis Date   Kidney stone    Kidney stones    Mastoiditis     Past Surgical History:  Procedure Laterality Date   ear surgery Right     MEDICATIONS:  Prior to Admission medications   Medication Sig Start Date End Date Taking? Authorizing Provider  albuterol (PROVENTIL HFA;VENTOLIN HFA) 108 (90 BASE) MCG/ACT inhaler Inhale 1-2 puffs into the lungs every 6 (six) hours as needed for wheezing or shortness of breath.    [provider]  escitalopram (LEXAPRO) 20 MG tablet Take 20 mg by mouth every morning.    [provider]  fluticasone (FLOVENT HFA) 220 MCG/ACT inhaler Inhale 1 puff into the lungs 2 (two) times daily.    [provider]  glucose 4 GM chewable tablet Chew 1 tablet by mouth as needed for low blood sugar.    [provider]  levothyroxine (SYNTHROID, LEVOTHROID) 150 MCG tablet Take 150 mcg by mouth daily.    [provider]  meloxicam (MOBIC) 7.5 MG tablet Take 7.5 mg by mouth 2 (two) times daily.    [provider]  montelukast (SINGULAIR) 10 MG tablet Take 10 mg by mouth daily.    [provider]  tamsulosin  (FLOMAX ) 0.4 MG CAPS capsule Take 1 capsule (0.4 mg total) by mouth daily. 03/12/15   Yvonna Alm Cough, MD    Physical Exam   Triage Vital Signs: ED Triage Vitals  Encounter Vitals Group     BP 06/13/24 2146 (!) 140/93     Girls Systolic BP Percentile --      Girls Diastolic BP Percentile --      Boys Systolic BP Percentile --      Boys Diastolic BP Percentile --      Pulse Rate 06/13/24 2146 76     Resp 06/13/24 2146 20     Temp 06/13/24 2146 98.5 F (36.9 C)     Temp Source 06/13/24 2146 Oral     SpO2 06/13/24 2146 97 %     Weight 06/13/24 2145 280 lb (127 kg)     Height 06/13/24 2145 5' 10 (1.778 m)     Head Circumference --      Peak Flow --      Pain Score 06/13/24 2145 0     Pain Loc --      Pain Education --      Exclude from Growth Chart --     Most recent vital signs:  Vitals:   06/13/24 2146  BP: (!) 140/93  Pulse: 76  Resp: 20  Temp: 98.5 F (36.9 C)  SpO2: 97%    CONSTITUTIONAL: Alert, responds appropriately to questions. Well-appearing; well-nourished HEAD: Normocephalic, atraumatic EYES: Conjunctivae clear, pupils appear equal, sclera nonicteric ENT: normal nose; moist mucous membranes NECK: Supple, normal ROM CARD: RRR; S1 and S2 appreciated RESP: Normal chest excursion without splinting or tachypnea; breath sounds clear and equal bilaterally; no wheezes, no rhonchi, no rales, no hypoxia or respiratory distress, speaking full sentences ABD/GI: Non-distended; soft, non-tender, no rebound, no guarding, no peritoneal signs BACK: The back appears normal EXT: Normal ROM in all joints; no deformity noted, no edema, no calf tenderness or calf swelling SKIN: Normal color for age and race; warm; no rash on exposed skin NEURO: Moves  all extremities equally, normal speech, no facial asymmetry, normal sensation PSYCH: The patient's mood and manner are appropriate.   ED Results / Procedures / Treatments   LABS: (all labs ordered are listed, but only abnormal results are displayed) Labs Reviewed  BASIC METABOLIC PANEL WITH GFR - Abnormal; Notable for the following components:      Result Value   Creatinine, Ser 1.54 (*)    GFR, Estimated 55 (*)    All other components within normal limits  CBC - Abnormal; Notable for the following components:   Hemoglobin 12.4 (*)    HCT 38.7 (*)    All other components within normal limits  CK  MAGNESIUM  TROPONIN I (HIGH SENSITIVITY)  TROPONIN I (HIGH SENSITIVITY)     EKG:  EKG Interpretation Date/Time:  Wednesday June 13 2024 21:50:07 EDT Ventricular Rate:  72 PR Interval:  142 QRS Duration:  110 QT Interval:  380 QTC Calculation: 416 R Axis:   86  Text Interpretation: Normal sinus rhythm Incomplete right bundle branch block Borderline ECG When compared with ECG of 17-Feb-2011 22:59, No significant change was found Confirmed by Neomi Neptune [phone]) on 06/14/2024 12:04:21 AM         RADIOLOGY: My personal review and interpretation of imaging: Chest x-ray clear.  I have personally reviewed all radiology reports.   DG Chest 2 View Result Date: 06/13/2024 CLINICAL DATA:  Chest pain EXAM: CHEST - 2 VIEW COMPARISON:  01/09/2023 FINDINGS: Frontal and lateral views of the chest demonstrate unremarkable cardiac silhouette. No airspace disease, effusion, or pneumothorax. No acute bony abnormalities. IMPRESSION: 1. No acute intrathoracic process. Electronically Signed   By: Ozell Daring M.D.   On: 06/13/2024 22:11     PROCEDURES:  Critical Care performed: No   Procedures    IMPRESSION / MDM / ASSESSMENT AND PLAN / ED COURSE  I reviewed the triage vital signs and the nursing notes.    Patient here for atypical chest pain.  Currently  asymptomatic.    DIFFERENTIAL DIAGNOSIS (includes but not limited to):   Musculoskeletal chest pain, electrolyte derangement, low suspicion for ACS, PE, dissection, pneumonia, pneumothorax, CHF cervical myelopathy, spinal stenosis, multiple sclerosis   Patient's presentation is most consistent with acute complicated illness / injury requiring diagnostic workup.   PLAN: EKG shows no new ischemic change.  First troponin negative.  Second pending.  PERC negative.  Doubt PE.  Also doubt dissection given no symptoms currently.  Also no neurodeficits.  Low suspicion for critical spinal stenosis, epidural abscess or hematoma, discitis or osteomyelitis, multiple sclerosis, transverse myelitis.    Does have a mild AKI and reports working in the yard today.  Will give IV  fluids.  Will check CK level.  Potassium level is normal.  Will check magnesium level.   MEDICATIONS GIVEN IN ED: Medications  sodium chloride  0.9 % bolus 1,000 mL (0 mLs Intravenous Stopped 06/14/24 0042)     ED COURSE: CK level normal.  Magnesium level normal.  Second troponin negative.  Patient still asymptomatic.  Will have him follow-up with his primary care doctor.  Recommend increase fluid intake, Tylenol  as needed, avoiding NSAIDs due to his AKI.  He verbalized understanding.   At this time, I do not feel there is any life-threatening condition present. I reviewed all nursing notes, vitals, pertinent previous records.  All lab and urine results, EKGs, imaging ordered have been independently reviewed and interpreted by myself.  I reviewed all available radiology reports from any imaging ordered this visit.  Based on my assessment, I feel the patient is safe to be discharged home without further emergent workup and can continue workup as an outpatient as needed. Discussed all findings, treatment plan as well as usual and customary return precautions.  They verbalize understanding and are comfortable with this plan.  Outpatient  follow-up has been provided as needed.  All questions have been answered.    CONSULTS:  none   OUTSIDE RECORDS REVIEWED: Reviewed last podiatry notes in 2015.       FINAL CLINICAL IMPRESSION(S) / ED DIAGNOSES   Final diagnoses:  Atypical chest pain  AKI (acute kidney injury) (HCC)     Rx / DC Orders   ED Discharge Orders     None        Note:  This document was prepared using Dragon voice recognition software and may include unintentional dictation errors.   Antrone Walla, Josette SAILOR, DO 06/14/24 0225

## 2024-06-14 LAB — MAGNESIUM: Magnesium: 2 mg/dL (ref 1.7–2.4)

## 2024-06-14 LAB — TROPONIN I (HIGH SENSITIVITY): Troponin I (High Sensitivity): 6 ng/L (ref <18)

## 2024-06-14 NOTE — Discharge Instructions (Addendum)
 I recommend that you fluid intake  Please avoid NSAIDs such as ibuprofen, aspirin, Aleve.  Tylenol  is safe to take.  I recommend that your primary care provider recheck your creatinine in 1 to 2 weeks.  It was elevated today at 1.48.

## 2024-06-14 NOTE — ED Provider Notes (Incomplete)
 [facility] Provider Note    Event Date/Time   First MD Initiated Contact with Patient 06/13/24 2308     (approximate)   History   Chest Pain   HPI  Dylan Willis is a 48 y.o. male with history of obesity, kidney stones who presents to the emergency department complaints of electric shocklike left-sided chest pain without radiation that occurred earlier today while working in the yard.  He states that symptoms only lasted for a few seconds and then would resolve.  No aggravating or alleviating factors.  Has never had similar symptoms.  No other numbness, tingling, weakness, headache, neck or back pain.  No shortness of breath, nausea or vomiting, fever or cough, lower extremity swelling or pain.  No history of PE, DVT, exogenous estrogen use, recent fractures, surgery, trauma, hospitalization, prolonged travel or other immobilization. No lower extremity swelling or pain. No calf tenderness.  Asymptomatic currently.   History provided by patient, wife.    Past Medical History:  Diagnosis Date  . Kidney stone   . Kidney stones   . Mastoiditis     Past Surgical History:  Procedure Laterality Date  . ear surgery Right     MEDICATIONS:  Prior to Admission medications   Medication Sig Start Date End Date Taking? Authorizing Provider  albuterol (PROVENTIL HFA;VENTOLIN HFA) 108 (90 BASE) MCG/ACT inhaler Inhale 1-2 puffs into the lungs every 6 (six) hours as needed for wheezing or shortness of breath.    [provider]  escitalopram (LEXAPRO) 20 MG tablet Take 20 mg by mouth every morning.    [provider]  fluticasone (FLOVENT HFA) 220 MCG/ACT inhaler Inhale 1 puff into the lungs 2 (two) times daily.    [provider]  glucose 4 GM chewable tablet Chew 1 tablet by mouth as needed for low blood sugar.    [provider]  levothyroxine (SYNTHROID, LEVOTHROID) 150 MCG tablet Take 150 mcg by mouth daily.    [provider]  meloxicam (MOBIC) 7.5 MG tablet Take 7.5 mg by mouth 2 (two) times daily.    [provider]  montelukast (SINGULAIR) 10 MG tablet Take 10 mg by mouth daily.    [provider]  tamsulosin  (FLOMAX ) 0.4 MG CAPS capsule Take 1 capsule (0.4 mg total) by mouth daily. 03/12/15   Yvonna Alm Cough, MD    Physical Exam   Triage Vital Signs: ED Triage Vitals  Encounter Vitals Group     BP 06/13/24 2146 (!) 140/93     Girls Systolic BP Percentile --      Girls Diastolic BP Percentile --      Boys Systolic BP Percentile --      Boys Diastolic BP Percentile --      Pulse Rate 06/13/24 2146 76     Resp 06/13/24 2146 20     Temp 06/13/24 2146 98.5 F (36.9 C)     Temp Source 06/13/24 2146 Oral     SpO2 06/13/24 2146 97 %     Weight 06/13/24 2145 280 lb (127 kg)     Height 06/13/24 2145 5' 10 (1.778 m)     Head Circumference --      Peak Flow --      Pain Score 06/13/24 2145 0     Pain Loc --      Pain Education --      Exclude from Growth Chart --     Most recent vital signs:  Vitals:   06/13/24 2146  BP: (!) 140/93  Pulse: 76  Resp: 20  Temp: 98.5 F (36.9 C)  SpO2: 97%    CONSTITUTIONAL: Alert, responds appropriately to questions. Well-appearing; well-nourished HEAD: Normocephalic, atraumatic EYES: Conjunctivae clear, pupils appear equal, sclera nonicteric ENT: normal nose; moist mucous membranes NECK: Supple, normal ROM CARD: RRR; S1 and S2 appreciated RESP: Normal chest excursion without splinting or tachypnea; breath sounds clear and equal bilaterally; no wheezes, no rhonchi, no rales, no hypoxia or respiratory distress, speaking full sentences ABD/GI: Non-distended; soft, non-tender, no rebound, no guarding, no peritoneal signs BACK: The back appears normal EXT: Normal ROM in all joints; no deformity noted, no edema, no calf tenderness or calf swelling SKIN: Normal color for age and race; warm; no rash on exposed skin NEURO: Moves  all extremities equally, normal speech, no facial asymmetry, normal sensation PSYCH: The patient's mood and manner are appropriate.   ED Results / Procedures / Treatments   LABS: (all labs ordered are listed, but only abnormal results are displayed) Labs Reviewed  BASIC METABOLIC PANEL WITH GFR - Abnormal; Notable for the following components:      Result Value   Creatinine, Ser 1.54 (*)    GFR, Estimated 55 (*)    All other components within normal limits  CBC - Abnormal; Notable for the following components:   Hemoglobin 12.4 (*)    HCT 38.7 (*)    All other components within normal limits  TROPONIN I (HIGH SENSITIVITY)     EKG:  EKG Interpretation Date/Time:    Ventricular Rate:    PR Interval:    QRS Duration:    QT Interval:    QTC Calculation:   R Axis:      Text Interpretation:           RADIOLOGY: Dylan personal review and interpretation of imaging:  ***  I have personally reviewed all radiology reports.   DG Chest 2 View Result Date: 06/13/2024 CLINICAL DATA:  Chest pain EXAM: CHEST - 2 VIEW COMPARISON:  01/09/2023 FINDINGS: Frontal and lateral views of the chest demonstrate unremarkable cardiac silhouette. No airspace disease, effusion, or pneumothorax. No acute bony abnormalities. IMPRESSION: 1. No acute intrathoracic process. Electronically Signed   By: Ozell Daring M.D.   On: 06/13/2024 22:11     PROCEDURES:  Critical Care performed: {CriticalCareYesNo:19197::Yes, see critical care procedure note(s),No}   CRITICAL CARE Performed by: Josette Nicolasa Milbrath   Total critical care time: *** minutes  Critical care time was exclusive of separately billable procedures and treating other patients.  Critical care was necessary to treat or prevent imminent or life-threatening deterioration.  Critical care was time spent personally by me on the following activities: development of treatment plan with patient and/or surrogate as well as nursing, discussions  with consultants, evaluation of patient's response to treatment, examination of patient, obtaining history from patient or surrogate, ordering and performing treatments and interventions, ordering and review of laboratory studies, ordering and review of radiographic studies, pulse oximetry and re-evaluation of patient's condition.   Procedures    IMPRESSION / MDM / ASSESSMENT AND PLAN / ED COURSE  I reviewed the triage vital signs and the nursing notes.    ***  The patient is on the cardiac monitor to evaluate for evidence of arrhythmia and/or significant heart rate changes.   DIFFERENTIAL DIAGNOSIS (includes but not limited to):   ***   Patient's presentation is most consistent with {EM COPA:27473}   PLAN: ***   MEDICATIONS  GIVEN IN ED: Medications  sodium chloride  0.9 % bolus 1,000 mL (has no administration in time range)     ED COURSE:  ***   CONSULTS:  ***   OUTSIDE RECORDS REVIEWED:  ***       FINAL CLINICAL IMPRESSION(S) / ED DIAGNOSES   Final diagnoses:  None     Rx / DC Orders   ED Discharge Orders     None        Note:  This document was prepared using Dragon voice recognition software and may include unintentional dictation errors.
